# Patient Record
Sex: Female | Born: 1965 | Race: White | Hispanic: No | Marital: Single
Health system: Southern US, Community
[De-identification: ages and names within clinical notes are randomized; demographics above are authoritative.]

---

## 1999-04-13 ENCOUNTER — Other Ambulatory Visit: Admission: RE | Admit: 1999-04-13 | Discharge: 1999-04-13 | Payer: Self-pay | Admitting: Gynecology

## 2000-05-16 ENCOUNTER — Other Ambulatory Visit: Admission: RE | Admit: 2000-05-16 | Discharge: 2000-05-16 | Payer: Self-pay | Admitting: Gynecology

## 2001-06-11 ENCOUNTER — Other Ambulatory Visit: Admission: RE | Admit: 2001-06-11 | Discharge: 2001-06-11 | Payer: Self-pay | Admitting: Gynecology

## 2003-03-04 ENCOUNTER — Other Ambulatory Visit: Admission: RE | Admit: 2003-03-04 | Discharge: 2003-03-04 | Payer: Self-pay | Admitting: Gynecology

## 2003-04-29 ENCOUNTER — Encounter: Payer: Self-pay | Admitting: Gynecology

## 2003-04-29 ENCOUNTER — Encounter: Admission: RE | Admit: 2003-04-29 | Discharge: 2003-04-29 | Payer: Self-pay | Admitting: Gynecology

## 2004-04-06 ENCOUNTER — Other Ambulatory Visit: Admission: RE | Admit: 2004-04-06 | Discharge: 2004-04-06 | Payer: Self-pay | Admitting: Gynecology

## 2005-08-11 ENCOUNTER — Other Ambulatory Visit: Admission: RE | Admit: 2005-08-11 | Discharge: 2005-08-11 | Payer: Self-pay | Admitting: Gynecology

## 2006-07-19 ENCOUNTER — Encounter: Admission: RE | Admit: 2006-07-19 | Discharge: 2006-07-19 | Payer: Self-pay | Admitting: Gynecology

## 2006-07-21 ENCOUNTER — Encounter (INDEPENDENT_AMBULATORY_CARE_PROVIDER_SITE_OTHER): Payer: Self-pay | Admitting: Specialist

## 2006-07-21 ENCOUNTER — Inpatient Hospital Stay (HOSPITAL_COMMUNITY): Admission: EM | Admit: 2006-07-21 | Discharge: 2006-07-23 | Payer: Self-pay | Admitting: Emergency Medicine

## 2006-08-15 ENCOUNTER — Other Ambulatory Visit: Admission: RE | Admit: 2006-08-15 | Discharge: 2006-08-15 | Payer: Self-pay | Admitting: Gynecology

## 2007-08-19 ENCOUNTER — Encounter: Admission: RE | Admit: 2007-08-19 | Discharge: 2007-08-19 | Payer: Self-pay | Admitting: Gynecology

## 2008-07-19 IMAGING — CT CT PELVIS W/ CM
4 of 6 series · 13 of 46 positions shown, 19 images · IV contrast ([ID] OMNI [id number])
Comparison: none

CLINICAL DATA: Pain.  Unable to obtain adequate ultrasound.  Evaluate for possible fibroid. 
 CT PELVIS WITH CONTRAST:
TECHNIQUE: Multidetector CT imaging of the pelvis was performed following the standard protocol during bolus administration of intravenous contrast.
 Contrast:  455cc of Omnipaque 300.

[Series 3: routine pelvis · axial · 0.70mm/px · z∈[-315,-20]mm · 3 of 40 slices shown, 7 images]
[im 1/40  soft-tissue]
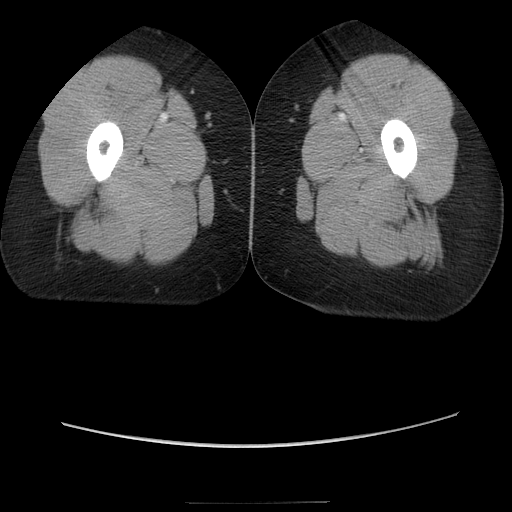
[im 1/40  lung]
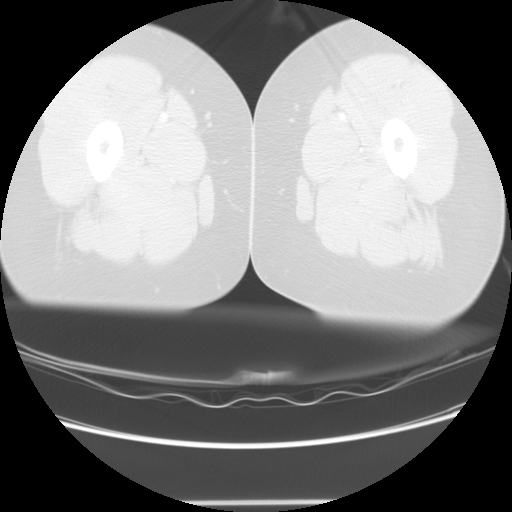
[im 1/40  bone]
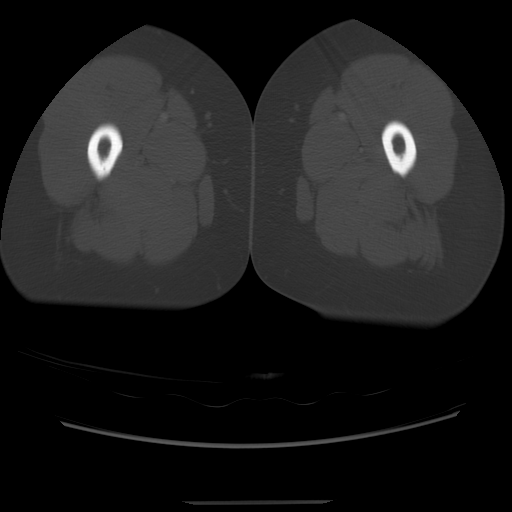
[im 20/40  soft-tissue]
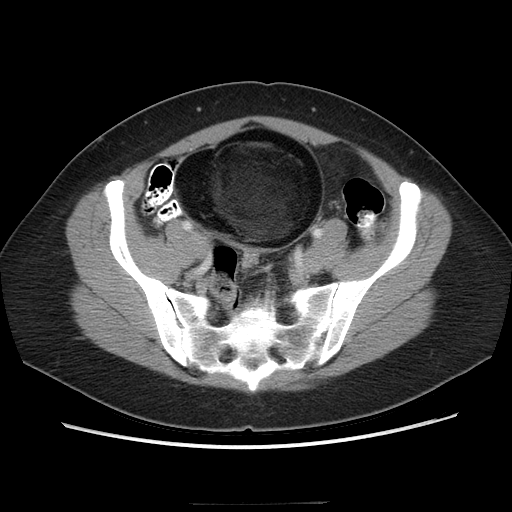
[im 20/40  lung]
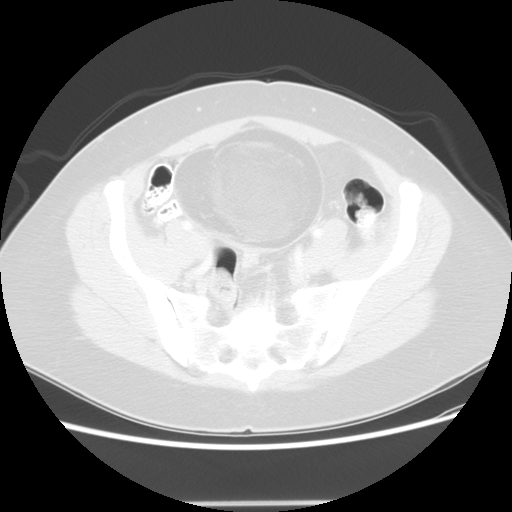
[im 40/40  soft-tissue]
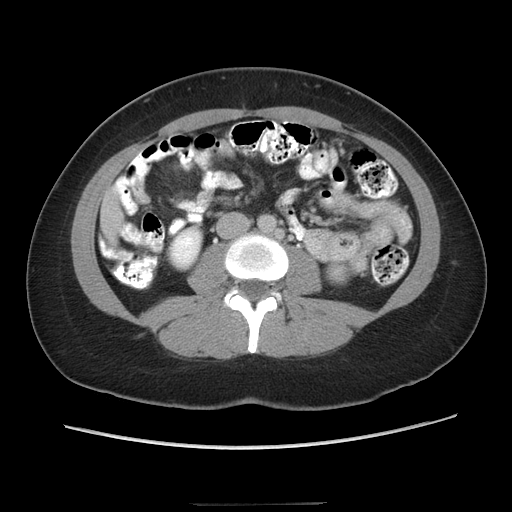
[im 40/40  lung]
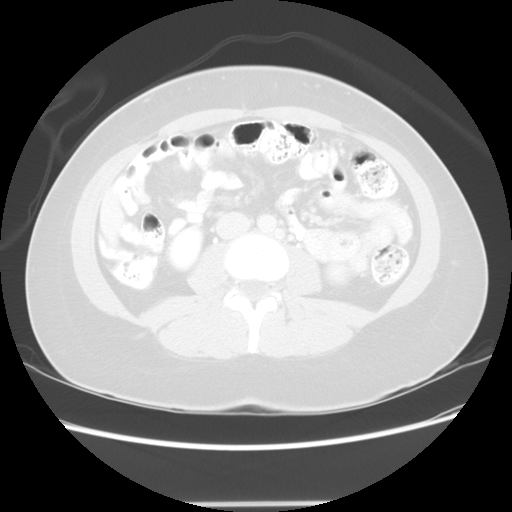

[Series 201: sagittal pelvis · sagittal · 0.70mm/px · 6 of 164 slices shown]
[im 12/164  soft-tissue]
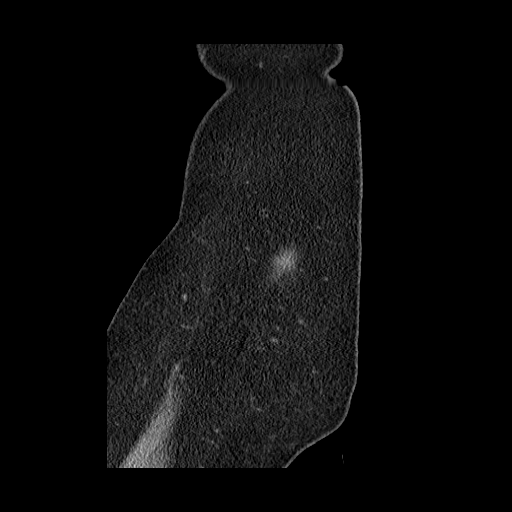
[im 35/164  soft-tissue]
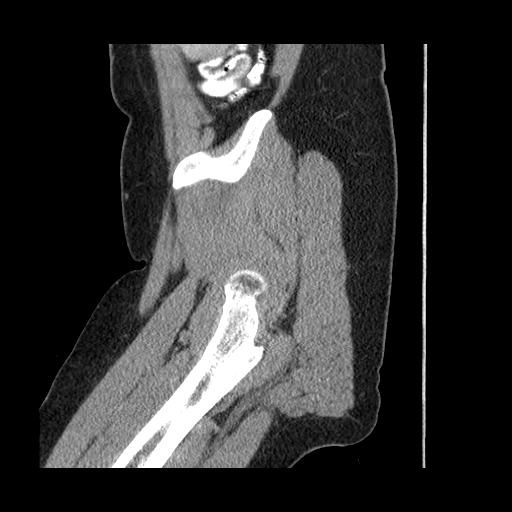
[im 59/164  soft-tissue]
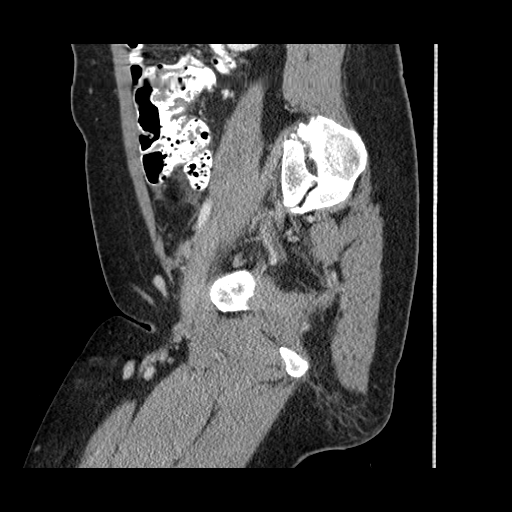
[im 70/164  soft-tissue]
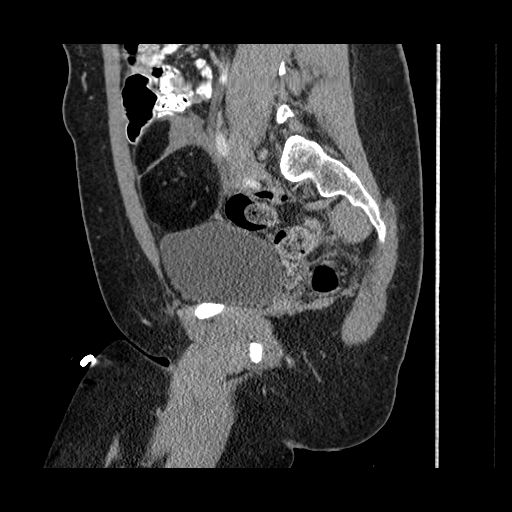
[im 94/164  soft-tissue]
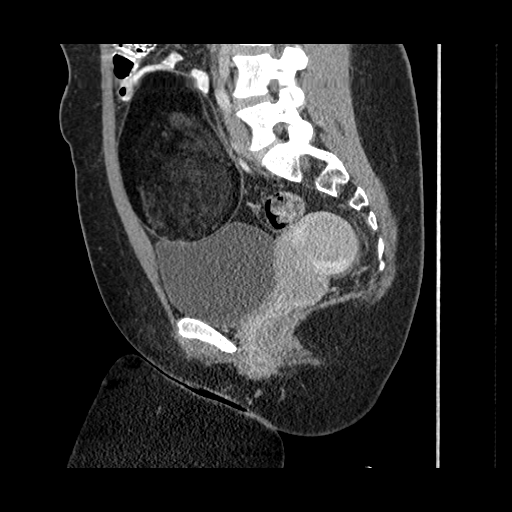
[im 105/164  soft-tissue]
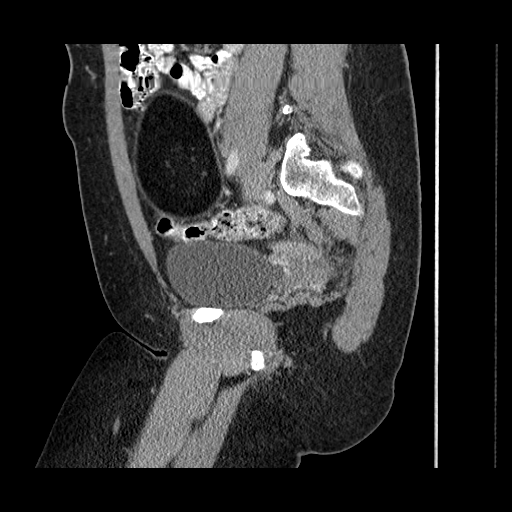

[Series 601: coronal body · coronal · 0.70mm/px · 1 of 106 slices shown, 2 images]
[im 36/106  soft-tissue]
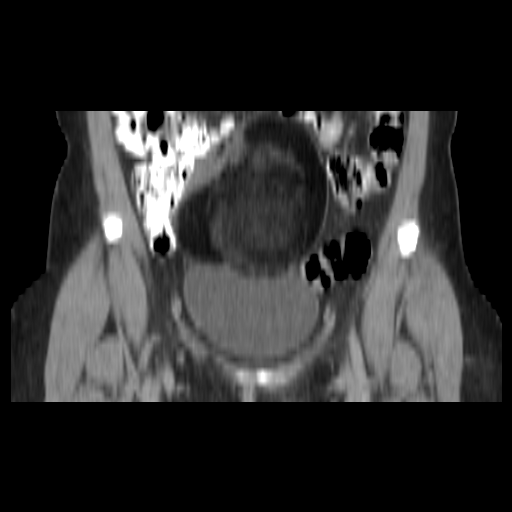
[im 36/106  bone]
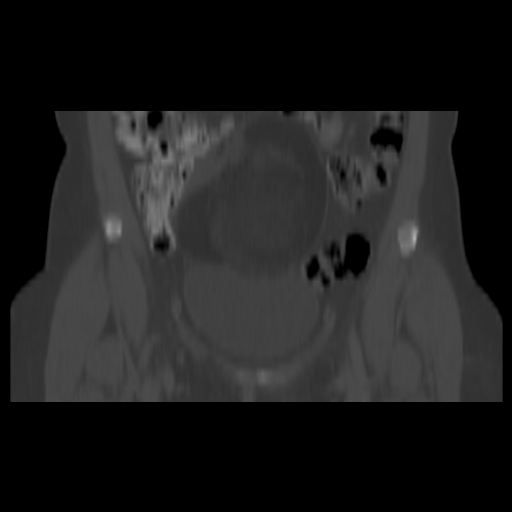

[Series 602: sagittal body · sagittal · 0.70mm/px · 3 of 145 slices shown, 4 images]
[im 49/145  soft-tissue]
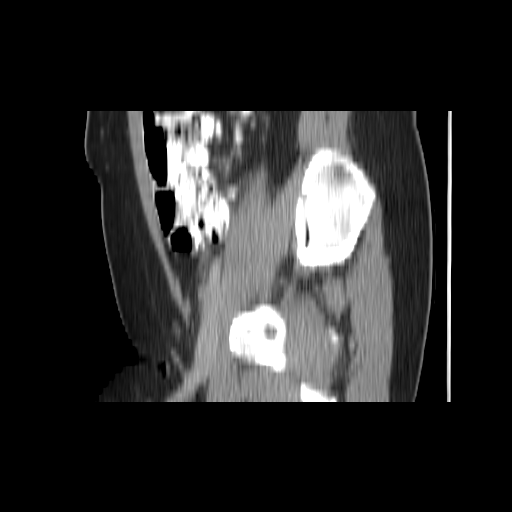
[im 65/145  soft-tissue]
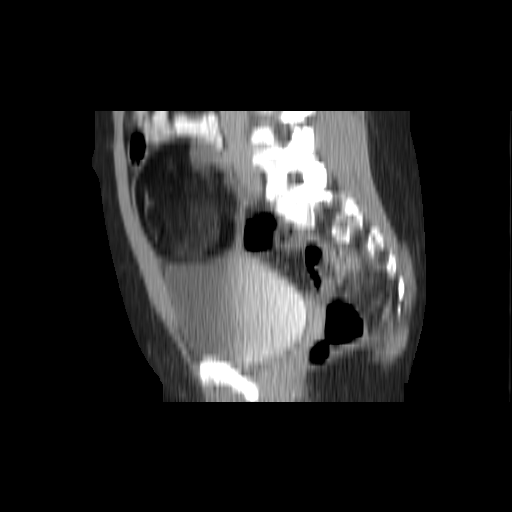
[im 65/145  bone]
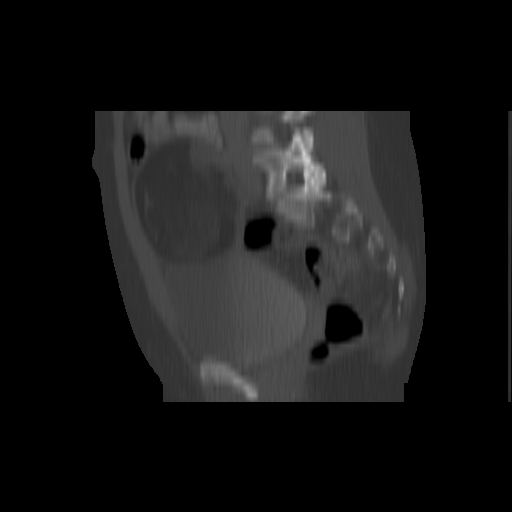
[im 81/145  soft-tissue]
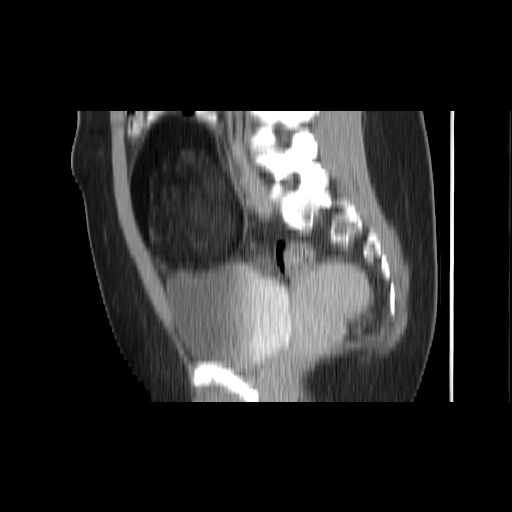

[13 of 46 positions shown; findings below may reference images not displayed]

FINDINGS: There is a large mass within the mid pelvis of mixed attenuation but largely fatty in density.  This mass measures 108mm in width, 99mm in depth, and 123mm in height and is consistent with a large dermoid probably emanating from the right ovary.  Within the posterior upper aspect of this complex largely fatty mass there is calcification present consistent with dermoid.  The left ovary appears grossly normal.  There is some inhomogeneous enhancement of the uterus and fibroid cannot be excluded.  The urinary bladder is unremarkable.  
 No other pelvic mass is seen.  No bony abnormality is noted.
IMPRESSION: 1.  Soft tissue mass in the mid pelvis largely comprised of fat with some calcification consistent with a dermoid or 110 x 91 x 123mm probably emanating from the right ovary.  
 2.  With some inhomogeneity of enhancement of the uterus fibroids cannot be excluded.

## 2009-04-23 ENCOUNTER — Encounter: Admission: RE | Admit: 2009-04-23 | Discharge: 2009-04-23 | Payer: Self-pay | Admitting: Gynecology

## 2010-05-11 ENCOUNTER — Encounter: Admission: RE | Admit: 2010-05-11 | Discharge: 2010-05-11 | Payer: Self-pay | Admitting: Gynecology

## 2010-12-02 NOTE — Discharge Summary (Signed)
NAMEDOMINICK, ZERTUCHE                ACCOUNT NO.:  [id number]   MEDICAL RECORD NO.:  [id number]        PATIENT TYPE:  LINP   LOCATION:                               FACILITY:  [facility]   PHYSICIAN:  Gretta Cool, M.D. DATE OF BIRTH:  09/27/1965   DATE OF ADMISSION:  07/21/2006  DATE OF DISCHARGE:  07/23/2006                               DISCHARGE SUMMARY   HISTORY OF PRESENT ILLNESS:  Ms. Lyndee Leo is a 45 year old white female  nulligravida who has a known benign cystic teratoma and fibroid which  has recently had rapid growth of both.  She is now having the onset of  severe pain over the last several months and torsion is suspected.  She  is now admitted for definitive therapy.   ADMISSION EXAM:  CHEST:  Clear to A and P.  CARDIOVASCULAR:  Heart rate and rhythm were regular without murmur,  gallop or cardiac enlargement.  ABDOMEN:  Direct tenderness.  No rebound with right lower quadrant mass  which is approximately 10 cm.  PELVIC:  External genitalia within normal limits for female.  Vagina is  clean and rugous.  The cervix is nulliparous and clean.  The uterus is  tender on exam.  Adnexa bilaterally tender.   IMPRESSION:  Abdominal pain with possible torsion of the right adnexal  mass.   PLAN:  IV and labs then decision regarding observation or treatment.   LABORATORY DATA:  Admission hemoglobin 14.5, hematocrit 41.0, white  count 12.0.  On July 22, 2006 hemoglobin was 12.0, hematocrit 34.9.  The GFR was 57 which was low, normal of 60.   HOSPITAL COURSE:  It was elected to proceed with surgical therapy for  torsion of her right ovary compatible with a benign cystic teratoma by  CT scan.  Procedures performed were exploratory laparotomy, incision and  evacuation of fluid material from the benign cystic teratoma, delivery  of teratoma and right salpingo-oophorectomy under general anesthesia.  The procedures were completed without any complications and the patient  was  returned to the recovery room in excellent condition.  Her  postoperative course was without complication.  It was noted that she  had excellent comfort, not using the PCA.  She was discharged on the  second postoperative day in excellent condition.  Final discharge  instructions including no heavy lifting or straining, no vaginal  entrance, and increase activity as tolerated.  She is to report any  fever of greater than 100.4 or failure of daily improvement.   DIET:  Regular.   MEDICATIONS:  Percocet 1 p.o. q.4h. p.r.n. discomfort.   FOLLOW UP:  She is to return to the office in one week for follow up.   CONDITION ON DISCHARGE:  Excellent.   FINAL DISCHARGE DIAGNOSIS:  Torsion of right ovarian mass compatible  with a benign cystic teratoma by CT scan with thrombosis in the ovarian  veins proximal to the torsion.   PROCEDURES PERFORMED:  Exploratory laparotomy, incision and evacuation  of fluid material from the benign cystic teratoma, delivery of the  teratoma and right salpingo-oophorectomy under general anesthesia.  Matt Holmes, N.P.    ______________________________  Gretta Cool, M.D.    EMK/MEDQ  D:  11/08/2006  T:  11/08/2006  Job:  [id number]

## 2010-12-02 NOTE — Op Note (Signed)
Mary Barajas, Mary Barajas                ACCOUNT NO.:  [id number]   MEDICAL RECORD NO.:  [id number]          PATIENT TYPE:  INP   LOCATION:  1607                         FACILITY:  [facility]   PHYSICIAN:  Gretta Cool, M.D. DATE OF BIRTH:  Jun 19, 1966   DATE OF PROCEDURE:  07/21/2006  DATE OF DISCHARGE:                               OPERATIVE REPORT   PREOPERATIVE DIAGNOSIS:  Torsion of a right ovarian mass compatible with  by benign cystic teratoma by CT.   POSTOPERATIVE DIAGNOSIS:  Torsion of a right ovarian mass compatible  with by benign cystic teratoma by CT, with thrombosis in the ovarian  veins proximal to the torsion.   PROCEDURE:  Exploratory laparotomy, incision and evacuation of the fluid  material from the benign cystic teratoma, delivery of the teratoma,  right salpingo-oophorectomy.   SURGEON:  Gretta Cool, M.D.   ANESTHESIA:  General orotracheal.   DESCRIPTION OF PROCEDURE:  Under excellent general anesthesia with the  patient prepped and draped in the supine position with PAS stockings in  place and Foley catheter draining her bladder, a Pfannenstiel incision  was made and extended through the fascia.  The rectus muscle was  then  separated in the midline and the abdomen explored.  There was an  enormous ovarian mass thought to be from the right ovary, with evidence  of hemorrhage into the tissues compatible with torsion.  No attempt was  made to untorse the ovary until the blood supply could be visualized.  The cyst wall was incised and the mucoid material suctioned as well as  possible with 14-gauge needle.  That material was so thick that a larger  incision was made and a larger suction device placed.  Once sufficient  material was removed that the cyst could be removed through the  abdominal incision, the ovary was delivered out through the incision and  the pedicle examined.  The tissue appeared viable, but there was clear  evidence of clot in the vessels.   The clot could be seen to move back  and forth as the proximal vessels were untwisted.  There were  approximately four complete turns on the blood supply to the ovary, with  the ovarian mass estimated to be greater than 10 cm.  At this point,  decision was made to remove the entire ovary and tube rather than risk  embolization from untwisting the ovarian vessels.  The contralateral  ovary was determined to be quite healthy and normal, with no evidence of  benign cystic teratoma on it.  At this point, the blood supply was  clamped across distal to the torsion site without  untwisting the  ovarian blood supply.  A Heaney clamp was used to clamp the pedicle.  The pedicle was then transected and ligated with 0 Vicryl.  A free tie  was used then to doubly ligate the ovarian vessels.  At this point, a  suture of 2-0 Novofil was used to pull the round ligament over the stump  of vessels to help prevent adhesion formation and peritonealize this  surface.  At this point,  the pelvis was irrigated with large quantities  of lactated Ringer's to remove any debris.  At this point, the mass was  opened and found to contain the mucoid sebaceous material and an  enormous ball of hair.  No other elements could be definitely  identified.  At this point the abdominal peritoneum was closed with  running suture of 0 Monocryl.  The rectus muscles were then approximated  in the midline.  The fascia was then closed in the center with a single  suture of 0 Vicryl.  The incision was then closed from each angle to the  midline with a second suture of 0 Vicryl.  The subcutaneous tissues were  approximated with  interrupted sutures of 3-0 Vicryl and skin closed with skin staples and  Steri-Strips.  At the end procedure, sponge and lap counts were correct.  There were no complications.  The patient returned to the recovery room  in excellent condition.           ______________________________  Gretta Cool,  M.D.     CWL/MEDQ  D:  07/21/2006  T:  07/22/2006  Job:  [id number]

## 2011-03-15 ENCOUNTER — Other Ambulatory Visit: Payer: Self-pay | Admitting: Gynecology

## 2011-04-18 ENCOUNTER — Other Ambulatory Visit: Payer: Self-pay | Admitting: Gynecology

## 2011-04-18 DIAGNOSIS — Z1231 Encounter for screening mammogram for malignant neoplasm of breast: Secondary | ICD-10-CM

## 2011-05-19 ENCOUNTER — Ambulatory Visit: Payer: Self-pay

## 2011-06-14 ENCOUNTER — Ambulatory Visit
Admission: RE | Admit: 2011-06-14 | Discharge: 2011-06-14 | Disposition: A | Discharge status: Home / Self Care | Payer: BC Managed Care – PPO | Source: Ambulatory Visit | Attending: Gynecology | Admitting: Gynecology

## 2011-06-14 DIAGNOSIS — Z1231 Encounter for screening mammogram for malignant neoplasm of breast: Secondary | ICD-10-CM

## 2012-03-26 ENCOUNTER — Other Ambulatory Visit: Payer: Self-pay | Admitting: Gynecology

## 2014-01-13 ENCOUNTER — Other Ambulatory Visit: Payer: Self-pay

## 2014-01-13 DIAGNOSIS — Z1231 Encounter for screening mammogram for malignant neoplasm of breast: Secondary | ICD-10-CM

## 2014-02-02 ENCOUNTER — Ambulatory Visit
Admission: RE | Admit: 2014-02-02 | Discharge: 2014-02-02 | Disposition: A | Discharge status: Home / Self Care | Payer: BC Managed Care – PPO | Source: Ambulatory Visit

## 2014-02-02 DIAGNOSIS — Z1231 Encounter for screening mammogram for malignant neoplasm of breast: Secondary | ICD-10-CM

## 2015-05-18 ENCOUNTER — Other Ambulatory Visit: Payer: Self-pay | Admitting: Gynecology

## 2015-05-18 DIAGNOSIS — R928 Other abnormal and inconclusive findings on diagnostic imaging of breast: Secondary | ICD-10-CM

## 2015-05-18 DIAGNOSIS — N6310 Unspecified lump in the right breast, unspecified quadrant: Secondary | ICD-10-CM

## 2015-05-20 ENCOUNTER — Other Ambulatory Visit: Payer: Self-pay | Admitting: Gynecology

## 2015-05-20 DIAGNOSIS — N6310 Unspecified lump in the right breast, unspecified quadrant: Secondary | ICD-10-CM

## 2015-05-20 DIAGNOSIS — R928 Other abnormal and inconclusive findings on diagnostic imaging of breast: Secondary | ICD-10-CM

## 2015-05-24 ENCOUNTER — Ambulatory Visit
Admission: RE | Admit: 2015-05-24 | Discharge: 2015-05-24 | Disposition: A | Discharge status: Home / Self Care | Payer: 59 | Source: Ambulatory Visit | Attending: Gynecology | Admitting: Gynecology

## 2015-05-24 DIAGNOSIS — N6310 Unspecified lump in the right breast, unspecified quadrant: Secondary | ICD-10-CM

## 2015-05-24 DIAGNOSIS — R928 Other abnormal and inconclusive findings on diagnostic imaging of breast: Secondary | ICD-10-CM

## 2019-10-22 DIAGNOSIS — A6000 Herpesviral infection of urogenital system, unspecified: Secondary | ICD-10-CM | POA: Insufficient documentation

## 2019-10-24 DIAGNOSIS — U071 COVID-19: Secondary | ICD-10-CM | POA: Insufficient documentation

## 2021-05-11 ENCOUNTER — Ambulatory Visit (INDEPENDENT_AMBULATORY_CARE_PROVIDER_SITE_OTHER): Payer: No Typology Code available for payment source

## 2021-05-11 ENCOUNTER — Ambulatory Visit: Payer: No Typology Code available for payment source | Admitting: Podiatry

## 2021-05-11 ENCOUNTER — Other Ambulatory Visit: Payer: Self-pay

## 2021-05-11 DIAGNOSIS — N766 Ulceration of vulva: Secondary | ICD-10-CM | POA: Insufficient documentation

## 2021-05-11 DIAGNOSIS — M2012 Hallux valgus (acquired), left foot: Secondary | ICD-10-CM

## 2021-05-11 DIAGNOSIS — A6000 Herpesviral infection of urogenital system, unspecified: Secondary | ICD-10-CM | POA: Insufficient documentation

## 2021-05-11 NOTE — Progress Notes (Signed)
   Subjective: 55 y.o. female presents today as a new patient for evaluation of pain and tenderness associated to a bunion to the patient's left foot.  Patient states that she has had a bunion for several years and progressively it is gotten more painful especially over the past few months.  The pain radiates to the foot.  She is tried different treatment modalities including shoe gear modification.  She presents for further treatment and evaluation   No past medical history on file.    Objective: Physical Exam General: The patient is alert and oriented x3 in no acute distress.  Dermatology: Skin is cool, dry and supple bilateral lower extremities. Negative for open lesions or macerations.  Vascular: Palpable pedal pulses bilaterally. No edema or erythema noted. Capillary refill within normal limits.  Neurological: Epicritic and protective threshold grossly intact bilaterally.   Musculoskeletal Exam: Clinical evidence of bunion deformity noted to the respective foot. There is moderate pain on palpation range of motion of the first MPJ. Lateral deviation of the hallux noted consistent with hallux abductovalgus.  Radiographic Exam: Increased intermetatarsal angle greater than 15 with a hallux abductus angle greater than 30 noted on AP view. Moderate degenerative changes noted within the first MPJ.  Assessment: 1. HAV w/ bunion deformity left   Plan of Care:  1. Patient was evaluated. X-Rays reviewed. 2.  Today we discussed the etiology and management of bunion deformities.  The patient would like to have surgery in the future however she needs to check with her work and find a good time for surgery.  The details of the procedure were explained and all possible complications and the postoperative recovery was also explained.  All patient questions were answered.  No guarantees were expressed or implied. 3.  In the meantime, continue conservative management including wide shoes and arch  supports 4.  Return to clinic for surgical consult when she is ready for surgery  *Many of her family members are my patients.  Billing specialist at [employer] on [address]   Felecia Shelling, [facility]  Dr. Felecia Shelling, DPM    [address] [place], [place] [address]                Office [phone]  Fax [phone]

## 2022-09-07 ENCOUNTER — Other Ambulatory Visit: Payer: Self-pay | Admitting: Gynecology

## 2022-09-07 DIAGNOSIS — Z1231 Encounter for screening mammogram for malignant neoplasm of breast: Secondary | ICD-10-CM

## 2022-10-19 ENCOUNTER — Ambulatory Visit: Payer: Self-pay

## 2022-11-24 ENCOUNTER — Other Ambulatory Visit: Payer: Self-pay | Admitting: Gynecology

## 2022-11-24 DIAGNOSIS — R928 Other abnormal and inconclusive findings on diagnostic imaging of breast: Secondary | ICD-10-CM

## 2022-12-05 ENCOUNTER — Ambulatory Visit: Payer: Self-pay

## 2022-12-05 ENCOUNTER — Ambulatory Visit
Admission: RE | Admit: 2022-12-05 | Discharge: 2022-12-05 | Disposition: A | Discharge status: Home / Self Care | Payer: Self-pay | Source: Ambulatory Visit | Attending: Gynecology | Admitting: Gynecology

## 2022-12-05 ENCOUNTER — Ambulatory Visit: Admission: RE | Admit: 2022-12-05 | Payer: Self-pay | Source: Ambulatory Visit

## 2022-12-05 DIAGNOSIS — R928 Other abnormal and inconclusive findings on diagnostic imaging of breast: Secondary | ICD-10-CM

## 2024-01-01 ENCOUNTER — Other Ambulatory Visit: Payer: Self-pay | Admitting: Internal Medicine

## 2024-01-01 DIAGNOSIS — R946 Abnormal results of thyroid function studies: Secondary | ICD-10-CM

## 2024-01-10 ENCOUNTER — Ambulatory Visit
Admission: RE | Admit: 2024-01-10 | Discharge: 2024-01-10 | Disposition: A | Discharge status: Home / Self Care | Source: Ambulatory Visit | Attending: Internal Medicine | Admitting: Internal Medicine

## 2024-01-10 DIAGNOSIS — R946 Abnormal results of thyroid function studies: Secondary | ICD-10-CM
